# Patient Record
Sex: Female | Born: 1935 | Race: White | Hispanic: No | Smoking: Never smoker
Health system: Southern US, Community
[De-identification: ages and names within clinical notes are randomized; demographics above are authoritative.]

## PROBLEM LIST (undated history)

## (undated) DIAGNOSIS — E119 Type 2 diabetes mellitus without complications: Secondary | ICD-10-CM

## (undated) DIAGNOSIS — D649 Anemia, unspecified: Secondary | ICD-10-CM

## (undated) DIAGNOSIS — I2510 Atherosclerotic heart disease of native coronary artery without angina pectoris: Secondary | ICD-10-CM

## (undated) DIAGNOSIS — K759 Inflammatory liver disease, unspecified: Secondary | ICD-10-CM

## (undated) DIAGNOSIS — N898 Other specified noninflammatory disorders of vagina: Secondary | ICD-10-CM

## (undated) DIAGNOSIS — I10 Essential (primary) hypertension: Secondary | ICD-10-CM

## (undated) DIAGNOSIS — F329 Major depressive disorder, single episode, unspecified: Secondary | ICD-10-CM

## (undated) DIAGNOSIS — F32A Depression, unspecified: Secondary | ICD-10-CM

## (undated) DIAGNOSIS — K8020 Calculus of gallbladder without cholecystitis without obstruction: Secondary | ICD-10-CM

## (undated) HISTORY — PX: DIAGNOSTIC LAPAROSCOPY: SUR761

## (undated) HISTORY — PX: ABDOMINAL HYSTERECTOMY: SHX81

---

## 2004-02-10 ENCOUNTER — Ambulatory Visit: Payer: Self-pay | Admitting: Internal Medicine

## 2004-07-12 ENCOUNTER — Ambulatory Visit: Payer: Self-pay | Admitting: Gynecologic Oncology

## 2004-08-23 ENCOUNTER — Ambulatory Visit: Payer: Self-pay | Admitting: Internal Medicine

## 2004-11-29 ENCOUNTER — Ambulatory Visit: Payer: Self-pay | Admitting: Oncology

## 2005-04-18 ENCOUNTER — Ambulatory Visit: Payer: Self-pay | Admitting: Gynecologic Oncology

## 2005-05-01 ENCOUNTER — Ambulatory Visit: Payer: Self-pay | Admitting: Internal Medicine

## 2005-05-15 ENCOUNTER — Other Ambulatory Visit: Payer: Self-pay

## 2005-05-15 ENCOUNTER — Ambulatory Visit: Payer: Self-pay | Admitting: Gynecologic Oncology

## 2005-05-16 ENCOUNTER — Ambulatory Visit: Payer: Self-pay | Admitting: Gynecologic Oncology

## 2005-06-13 ENCOUNTER — Ambulatory Visit: Payer: Self-pay | Admitting: Gynecologic Oncology

## 2006-03-13 ENCOUNTER — Ambulatory Visit: Payer: Self-pay | Admitting: Unknown Physician Specialty

## 2006-06-06 ENCOUNTER — Ambulatory Visit: Payer: Self-pay | Admitting: Internal Medicine

## 2007-08-07 ENCOUNTER — Ambulatory Visit: Payer: Self-pay | Admitting: Internal Medicine

## 2007-08-28 ENCOUNTER — Ambulatory Visit: Payer: Self-pay | Admitting: Internal Medicine

## 2008-03-02 ENCOUNTER — Ambulatory Visit: Payer: Self-pay | Admitting: Gynecologic Oncology

## 2008-03-16 ENCOUNTER — Ambulatory Visit: Payer: Self-pay | Admitting: Gynecologic Oncology

## 2008-04-02 ENCOUNTER — Ambulatory Visit: Payer: Self-pay | Admitting: Gynecologic Oncology

## 2008-04-09 ENCOUNTER — Ambulatory Visit: Payer: Self-pay | Admitting: Gynecologic Oncology

## 2008-04-13 ENCOUNTER — Ambulatory Visit: Payer: Self-pay | Admitting: Gynecologic Oncology

## 2008-04-20 ENCOUNTER — Ambulatory Visit: Payer: Self-pay | Admitting: Gynecologic Oncology

## 2008-05-03 ENCOUNTER — Ambulatory Visit: Payer: Self-pay | Admitting: Gynecologic Oncology

## 2008-05-18 ENCOUNTER — Ambulatory Visit: Payer: Self-pay | Admitting: Gynecologic Oncology

## 2008-08-31 ENCOUNTER — Ambulatory Visit: Payer: Self-pay | Admitting: Gynecologic Oncology

## 2008-09-21 ENCOUNTER — Ambulatory Visit: Payer: Self-pay | Admitting: Gynecologic Oncology

## 2008-10-15 ENCOUNTER — Ambulatory Visit: Payer: Self-pay | Admitting: Internal Medicine

## 2008-12-31 ENCOUNTER — Ambulatory Visit: Payer: Self-pay | Admitting: Gynecologic Oncology

## 2009-01-18 ENCOUNTER — Ambulatory Visit: Payer: Self-pay | Admitting: Gynecologic Oncology

## 2009-01-31 ENCOUNTER — Ambulatory Visit: Payer: Self-pay | Admitting: Gynecologic Oncology

## 2009-05-03 ENCOUNTER — Ambulatory Visit: Payer: Self-pay | Admitting: Gynecologic Oncology

## 2009-05-17 ENCOUNTER — Ambulatory Visit: Payer: Self-pay | Admitting: Gynecologic Oncology

## 2009-05-31 ENCOUNTER — Ambulatory Visit: Payer: Self-pay | Admitting: Gynecologic Oncology

## 2009-07-31 ENCOUNTER — Ambulatory Visit: Payer: Self-pay | Admitting: Gynecologic Oncology

## 2009-08-16 ENCOUNTER — Ambulatory Visit: Payer: Self-pay | Admitting: Gynecologic Oncology

## 2009-08-31 ENCOUNTER — Ambulatory Visit: Payer: Self-pay | Admitting: Gynecologic Oncology

## 2009-09-06 ENCOUNTER — Ambulatory Visit: Payer: Self-pay | Admitting: Gynecologic Oncology

## 2009-09-30 ENCOUNTER — Ambulatory Visit: Payer: Self-pay | Admitting: Gynecologic Oncology

## 2009-10-18 ENCOUNTER — Ambulatory Visit: Payer: Self-pay | Admitting: Internal Medicine

## 2010-02-14 ENCOUNTER — Ambulatory Visit: Payer: Self-pay | Admitting: Gynecologic Oncology

## 2010-03-02 ENCOUNTER — Ambulatory Visit: Payer: Self-pay | Admitting: Gynecologic Oncology

## 2010-03-13 ENCOUNTER — Ambulatory Visit: Payer: Self-pay | Admitting: Unknown Physician Specialty

## 2010-06-20 ENCOUNTER — Ambulatory Visit: Payer: Self-pay | Admitting: Gynecologic Oncology

## 2010-08-01 ENCOUNTER — Ambulatory Visit: Payer: Self-pay | Admitting: Gynecologic Oncology

## 2010-08-07 LAB — PATHOLOGY REPORT

## 2010-08-25 ENCOUNTER — Ambulatory Visit: Payer: Self-pay | Admitting: Gynecologic Oncology

## 2010-09-01 ENCOUNTER — Ambulatory Visit: Payer: Self-pay | Admitting: Gynecologic Oncology

## 2010-09-05 ENCOUNTER — Ambulatory Visit: Payer: Self-pay | Admitting: Gynecologic Oncology

## 2010-10-01 ENCOUNTER — Ambulatory Visit: Payer: Self-pay | Admitting: Gynecologic Oncology

## 2010-11-01 ENCOUNTER — Ambulatory Visit: Payer: Self-pay | Admitting: Gynecologic Oncology

## 2010-11-08 ENCOUNTER — Ambulatory Visit: Payer: Self-pay | Admitting: Internal Medicine

## 2011-02-13 ENCOUNTER — Ambulatory Visit: Payer: Self-pay | Admitting: Gynecologic Oncology

## 2011-03-03 ENCOUNTER — Ambulatory Visit: Payer: Self-pay | Admitting: Gynecologic Oncology

## 2011-06-19 ENCOUNTER — Ambulatory Visit: Payer: Self-pay | Admitting: Gynecologic Oncology

## 2011-07-02 ENCOUNTER — Ambulatory Visit: Payer: Self-pay | Admitting: Gynecologic Oncology

## 2011-10-16 ENCOUNTER — Ambulatory Visit: Payer: Self-pay | Admitting: Gynecologic Oncology

## 2011-11-01 ENCOUNTER — Ambulatory Visit: Payer: Self-pay | Admitting: Gynecologic Oncology

## 2011-11-13 ENCOUNTER — Ambulatory Visit: Payer: Self-pay | Admitting: Internal Medicine

## 2012-02-19 ENCOUNTER — Ambulatory Visit: Payer: Self-pay | Admitting: Gynecologic Oncology

## 2012-03-02 ENCOUNTER — Ambulatory Visit: Payer: Self-pay | Admitting: Gynecologic Oncology

## 2012-08-19 ENCOUNTER — Ambulatory Visit: Payer: Self-pay | Admitting: Gynecologic Oncology

## 2012-08-31 ENCOUNTER — Ambulatory Visit: Payer: Self-pay | Admitting: Gynecologic Oncology

## 2012-11-13 ENCOUNTER — Ambulatory Visit: Payer: Self-pay | Admitting: Internal Medicine

## 2013-03-03 ENCOUNTER — Ambulatory Visit: Payer: Self-pay | Admitting: Gynecologic Oncology

## 2013-04-02 ENCOUNTER — Ambulatory Visit: Payer: Self-pay | Admitting: Gynecologic Oncology

## 2013-08-31 ENCOUNTER — Ambulatory Visit: Payer: Self-pay | Admitting: Gynecologic Oncology

## 2013-09-30 ENCOUNTER — Ambulatory Visit: Payer: Self-pay | Admitting: Gynecologic Oncology

## 2013-12-22 ENCOUNTER — Ambulatory Visit: Payer: Self-pay | Admitting: Internal Medicine

## 2014-12-03 ENCOUNTER — Other Ambulatory Visit: Payer: Self-pay | Admitting: Internal Medicine

## 2014-12-03 DIAGNOSIS — Z1231 Encounter for screening mammogram for malignant neoplasm of breast: Secondary | ICD-10-CM

## 2014-12-24 ENCOUNTER — Other Ambulatory Visit: Payer: Self-pay | Admitting: Internal Medicine

## 2014-12-24 ENCOUNTER — Ambulatory Visit
Admission: RE | Admit: 2014-12-24 | Discharge: 2014-12-24 | Disposition: A | Discharge status: Home / Self Care | Payer: Medicare Other | Source: Ambulatory Visit | Attending: Internal Medicine | Admitting: Internal Medicine

## 2014-12-24 DIAGNOSIS — Z1231 Encounter for screening mammogram for malignant neoplasm of breast: Secondary | ICD-10-CM | POA: Insufficient documentation

## 2015-04-08 ENCOUNTER — Other Ambulatory Visit: Payer: Self-pay | Admitting: Internal Medicine

## 2015-04-08 DIAGNOSIS — K8689 Other specified diseases of pancreas: Secondary | ICD-10-CM

## 2015-04-20 ENCOUNTER — Ambulatory Visit
Admission: RE | Admit: 2015-04-20 | Discharge: 2015-04-20 | Disposition: A | Discharge status: Home / Self Care | Payer: Medicare Other | Source: Ambulatory Visit | Attending: Internal Medicine | Admitting: Internal Medicine

## 2015-04-20 DIAGNOSIS — N281 Cyst of kidney, acquired: Secondary | ICD-10-CM | POA: Diagnosis not present

## 2015-04-20 DIAGNOSIS — K8689 Other specified diseases of pancreas: Secondary | ICD-10-CM

## 2015-04-20 DIAGNOSIS — K869 Disease of pancreas, unspecified: Secondary | ICD-10-CM | POA: Diagnosis not present

## 2015-04-20 DIAGNOSIS — K7689 Other specified diseases of liver: Secondary | ICD-10-CM | POA: Insufficient documentation

## 2015-04-20 MED ORDER — GADOBENATE DIMEGLUMINE 529 MG/ML IV SOLN
20.0000 mL | Freq: Once | INTRAVENOUS | Status: AC | PRN
  Administered 2015-04-20: 19 mL via INTRAVENOUS

## 2015-06-03 ENCOUNTER — Encounter: Payer: Self-pay | Admitting: *Deleted

## 2015-06-06 ENCOUNTER — Ambulatory Visit
Admission: RE | Admit: 2015-06-06 | Discharge: 2015-06-06 | Disposition: A | Discharge status: Home / Self Care | Payer: Medicare Other | Source: Ambulatory Visit | Attending: Unknown Physician Specialty | Admitting: Unknown Physician Specialty

## 2015-06-06 ENCOUNTER — Ambulatory Visit: Payer: Medicare Other | Admitting: Anesthesiology

## 2015-06-06 ENCOUNTER — Encounter: Payer: Self-pay | Admitting: *Deleted

## 2015-06-06 ENCOUNTER — Encounter
Admission: RE | Disposition: A | Discharge status: Home / Self Care | Payer: Self-pay | Source: Ambulatory Visit | Attending: Unknown Physician Specialty

## 2015-06-06 DIAGNOSIS — Z6831 Body mass index (BMI) 31.0-31.9, adult: Secondary | ICD-10-CM | POA: Diagnosis not present

## 2015-06-06 DIAGNOSIS — Z7982 Long term (current) use of aspirin: Secondary | ICD-10-CM | POA: Diagnosis not present

## 2015-06-06 DIAGNOSIS — K640 First degree hemorrhoids: Secondary | ICD-10-CM | POA: Diagnosis not present

## 2015-06-06 DIAGNOSIS — D123 Benign neoplasm of transverse colon: Secondary | ICD-10-CM | POA: Insufficient documentation

## 2015-06-06 DIAGNOSIS — E119 Type 2 diabetes mellitus without complications: Secondary | ICD-10-CM | POA: Diagnosis not present

## 2015-06-06 DIAGNOSIS — F329 Major depressive disorder, single episode, unspecified: Secondary | ICD-10-CM | POA: Insufficient documentation

## 2015-06-06 DIAGNOSIS — Z1211 Encounter for screening for malignant neoplasm of colon: Secondary | ICD-10-CM | POA: Insufficient documentation

## 2015-06-06 DIAGNOSIS — I2510 Atherosclerotic heart disease of native coronary artery without angina pectoris: Secondary | ICD-10-CM | POA: Diagnosis not present

## 2015-06-06 DIAGNOSIS — Z79899 Other long term (current) drug therapy: Secondary | ICD-10-CM | POA: Insufficient documentation

## 2015-06-06 DIAGNOSIS — Z90710 Acquired absence of both cervix and uterus: Secondary | ICD-10-CM | POA: Insufficient documentation

## 2015-06-06 DIAGNOSIS — I10 Essential (primary) hypertension: Secondary | ICD-10-CM | POA: Diagnosis not present

## 2015-06-06 HISTORY — PX: COLONOSCOPY WITH PROPOFOL: SHX5780

## 2015-06-06 HISTORY — DX: Calculus of gallbladder without cholecystitis without obstruction: K80.20

## 2015-06-06 HISTORY — DX: Depression, unspecified: F32.A

## 2015-06-06 HISTORY — DX: Morbid (severe) obesity due to excess calories: E66.01

## 2015-06-06 HISTORY — DX: Atherosclerotic heart disease of native coronary artery without angina pectoris: I25.10

## 2015-06-06 HISTORY — DX: Other specified noninflammatory disorders of vagina: N89.8

## 2015-06-06 HISTORY — DX: Major depressive disorder, single episode, unspecified: F32.9

## 2015-06-06 HISTORY — DX: Type 2 diabetes mellitus without complications: E11.9

## 2015-06-06 HISTORY — DX: Anemia, unspecified: D64.9

## 2015-06-06 HISTORY — DX: Essential (primary) hypertension: I10

## 2015-06-06 HISTORY — DX: Inflammatory liver disease, unspecified: K75.9

## 2015-06-06 LAB — GLUCOSE, CAPILLARY: Glucose-Capillary: 186 mg/dL — ABNORMAL HIGH (ref 65–99)

## 2015-06-06 SURGERY — COLONOSCOPY WITH PROPOFOL
Anesthesia: General

## 2015-06-06 MED ORDER — MIDAZOLAM HCL 5 MG/5ML IJ SOLN
INTRAMUSCULAR | Status: DC | PRN
  Administered 2015-06-06: 1 mg via INTRAVENOUS

## 2015-06-06 MED ORDER — SODIUM CHLORIDE 0.9 % IV SOLN
INTRAVENOUS | Status: DC
  Administered 2015-06-06: 07:00:00 via INTRAVENOUS

## 2015-06-06 MED ORDER — PROPOFOL 500 MG/50ML IV EMUL
INTRAVENOUS | Status: DC | PRN
  Administered 2015-06-06: 100 ug/kg/min via INTRAVENOUS

## 2015-06-06 MED ORDER — GLYCOPYRROLATE 0.2 MG/ML IJ SOLN
INTRAMUSCULAR | Status: DC | PRN
  Administered 2015-06-06: 0.2 mg via INTRAVENOUS

## 2015-06-06 MED ORDER — PROPOFOL 10 MG/ML IV BOLUS
INTRAVENOUS | Status: DC | PRN
  Administered 2015-06-06: 50 mg via INTRAVENOUS

## 2015-06-06 MED ORDER — SODIUM CHLORIDE 0.9 % IV SOLN: INTRAVENOUS | Status: DC

## 2015-06-06 MED ORDER — LIDOCAINE HCL (CARDIAC) 20 MG/ML IV SOLN
INTRAVENOUS | Status: DC | PRN
  Administered 2015-06-06: 100 mg via INTRAVENOUS

## 2015-06-06 MED ORDER — FENTANYL CITRATE (PF) 100 MCG/2ML IJ SOLN
INTRAMUSCULAR | Status: DC | PRN
  Administered 2015-06-06: 50 ug via INTRAVENOUS

## 2015-06-06 NOTE — Transfer of Care (Signed)
Immediate Anesthesia Transfer of Care Note  Patient: Tamara Whitney  Procedure(s) Performed: Procedure(s): COLONOSCOPY WITH PROPOFOL (N/A)  Patient Location: PACU  Anesthesia Type:General  Level of Consciousness: sedated  Airway & Oxygen Therapy: Patient Spontanous Breathing and Patient connected to nasal cannula oxygen  Post-op Assessment: Report given to RN and Post -op Vital signs reviewed and stable  Post vital signs: Reviewed and stable  Last Vitals:  Filed Vitals:   06/06/15 0802 06/06/15 0804  BP: 127/52 127/52  Pulse: 89 90  Temp: 36.1 C 36.1 C  Resp: 16 22    Complications: No apparent anesthesia complications

## 2015-06-06 NOTE — Anesthesia Preprocedure Evaluation (Signed)
Anesthesia Evaluation  Patient identified by MRN, date of birth, ID band Patient awake    Reviewed: Allergy & Precautions, NPO status , Patient's Chart, lab work & pertinent test results, reviewed documented beta blocker date and time   Airway Mallampati: II  TM Distance: >3 FB     Dental  (+) Chipped   Pulmonary           Cardiovascular hypertension, Pt. on medications + CAD       Neuro/Psych PSYCHIATRIC DISORDERS Depression    GI/Hepatic (+) Hepatitis -  Endo/Other  diabetes  Renal/GU      Musculoskeletal   Abdominal   Peds  Hematology  (+) anemia ,   Anesthesia Other Findings Obese.  Reproductive/Obstetrics                             Anesthesia Physical Anesthesia Plan  ASA: III  Anesthesia Plan: General   Post-op Pain Management:    Induction: Intravenous  Airway Management Planned: Nasal Cannula  Additional Equipment:   Intra-op Plan:   Post-operative Plan:   Informed Consent: I have reviewed the patients History and Physical, chart, labs and discussed the procedure including the risks, benefits and alternatives for the proposed anesthesia with the patient or authorized representative who has indicated his/her understanding and acceptance.     Plan Discussed with: CRNA  Anesthesia Plan Comments:         Anesthesia Quick Evaluation

## 2015-06-06 NOTE — Anesthesia Postprocedure Evaluation (Signed)
Anesthesia Post Note  Patient: Tamara Whitney  Procedure(s) Performed: Procedure(s) (LRB): COLONOSCOPY WITH PROPOFOL (N/A)  Patient location during evaluation: Endoscopy Anesthesia Type: General Level of consciousness: awake and alert Pain management: pain level controlled Vital Signs Assessment: post-procedure vital signs reviewed and stable Respiratory status: spontaneous breathing, nonlabored ventilation, respiratory function stable and patient connected to nasal cannula oxygen Cardiovascular status: blood pressure returned to baseline and stable Postop Assessment: no signs of nausea or vomiting Anesthetic complications: no    Last Vitals:  Filed Vitals:   06/06/15 0832 06/06/15 0842  BP: 166/68 174/66  Pulse: 78 72  Temp:    Resp: 18 19    Last Pain: There were no vitals filed for this visit.               Jonelle Bann S

## 2015-06-06 NOTE — H&P (Signed)
   Primary Care Physician:  Kirk Ruths., MD Primary Gastroenterologist:  Dr. Vira Agar  Pre-Procedure History & Physical: HPI:  Tamara Whitney is a 81 y.o. female is here for an colonoscopy.   Past Medical History  Diagnosis Date  . Coronary artery disease   . Depression   . Diabetes mellitus without complication ([place])   . Hepatitis   . Hypertension   . Anemia   . Vaginal lesion   . Morbid obesity ([place])   . Gall stones     Past Surgical History  Procedure Laterality Date  . Diagnostic laparoscopy    . Abdominal hysterectomy      Prior to Admission medications   Medication Sig Start Date End Date Taking? Authorizing Provider  amLODipine-benazepril (LOTREL) 5-20 MG capsule Take 1 capsule by mouth daily.   Yes Historical Provider, MD  aspirin (ASPIRIN EC) 81 MG EC tablet Take 81 mg by mouth daily. Swallow whole.   Yes Historical Provider, MD  Cholecalciferol 2000 units CAPS Take 2,000 Units by mouth.   Yes Historical Provider, MD  glimepiride (AMARYL) 2 MG tablet Take 2 mg by mouth daily with breakfast.   Yes Historical Provider, MD  losartan-hydrochlorothiazide (HYZAAR) 100-12.5 MG tablet Take 1 tablet by mouth daily.   Yes Historical Provider, MD  metFORMIN (GLUCOPHAGE) 500 MG tablet Take 500 mg by mouth 2 (two) times daily with a meal.   Yes Historical Provider, MD    Allergies as of 05/26/2015  . (Not on File)    History reviewed. No pertinent family history.  Social History   Social History  . Marital Status: Divorced    Spouse Name: N/A  . Number of Children: N/A  . Years of Education: N/A   Occupational History  . Not on file.   Social History Main Topics  . Smoking status: Never Smoker   . Smokeless tobacco: Never Used  . Alcohol Use: No  . Drug Use: No  . Sexual Activity: Not on file   Other Topics Concern  . Not on file   Social History Narrative    Review of Systems: See HPI, otherwise negative ROS  Physical Exam: BP 193/77 mmHg   Pulse 65  Temp(Src) 97.7 F (36.5 C) (Tympanic)  Resp 16  Ht 5\' 8"  (1.727 m)  Wt 95.255 kg (210 lb)  BMI 31.94 kg/m2  SpO2 100% General:   Alert,  pleasant and cooperative in NAD Head:  Normocephalic and atraumatic. Neck:  Supple; no masses or thyromegaly. Lungs:  Clear throughout to auscultation.    Heart:  Regular rate and rhythm. Abdomen:  Soft, nontender and nondistended. Normal bowel sounds, without guarding, and without rebound.   Neurologic:  Alert and  oriented x4;  grossly normal neurologically.  Impression/Plan: Tamara Whitney is here for an colonoscopy to be performed for [facility] colon polyps  Risks, benefits, limitations, and alternatives regarding  colonoscopy have been reviewed with the patient.  Questions have been answered.  All parties agreeable.   Gaylyn Cheers, MD  06/06/2015, 7:32 AM

## 2015-06-06 NOTE — Op Note (Signed)
[facility] Gastroenterology Patient Name: Tamara Whitney Procedure Date: 06/06/2015 7:27 AM MRN: [id number] Account #: [id number] Date of Birth: [birth year] Admit Type: Outpatient Age: 81 Room: [facility] ENDO ROOM 1 Gender: Female Note Status: Finalized Procedure:            Colonoscopy Indications:          High risk colon cancer surveillance: Personal history                        of colonic polyps Providers:            Manya Silvas, MD Referring MD:         Ocie Cornfield. Ouida Sills, MD (Referring MD) Medicines:            Propofol per Anesthesia Complications:        No immediate complications. Procedure:            Pre-Anesthesia Assessment:                       - After reviewing the risks and benefits, the patient                        was deemed in satisfactory condition to undergo the                        procedure.                       After obtaining informed consent, the colonoscope was                        passed under direct vision. Throughout the procedure,                        the patient's blood pressure, pulse, and oxygen                        saturations were monitored continuously. The                        Colonoscope was introduced through the anus and                        advanced to the the cecum, identified by appendiceal                        orifice and ileocecal valve. The colonoscopy was                        performed without difficulty. The patient tolerated the                        procedure well. The quality of the bowel preparation                        was excellent. Findings:      A diminutive polyp was found in the hepatic flexure. The polyp was       sessile. The polyp was removed with a jumbo cold forceps. Resection and       retrieval were complete.      Internal hemorrhoids were found  during endoscopy. The hemorrhoids were       small and Grade I (internal hemorrhoids that do not prolapse).      The exam was  otherwise without abnormality. Impression:           - One diminutive polyp at the hepatic flexure, removed                        with a jumbo cold forceps. Resected and retrieved.                       - Internal hemorrhoids.                       - The examination was otherwise normal. Recommendation:       - Await pathology results. Manya Silvas, MD 06/06/2015 7:58:43 AM This report has been signed electronically. Number of Addenda: 0 Note Initiated On: 06/06/2015 7:27 AM Scope Withdrawal Time: 0 hours 8 minutes 38 seconds  Total Procedure Duration: 0 hours 16 minutes 39 seconds       [facility]

## 2015-06-07 LAB — SURGICAL PATHOLOGY

## 2015-11-14 ENCOUNTER — Other Ambulatory Visit: Payer: Self-pay | Admitting: Internal Medicine

## 2015-11-14 DIAGNOSIS — Z1231 Encounter for screening mammogram for malignant neoplasm of breast: Secondary | ICD-10-CM

## 2015-12-26 ENCOUNTER — Other Ambulatory Visit: Payer: Self-pay | Admitting: Internal Medicine

## 2015-12-26 ENCOUNTER — Ambulatory Visit
Admission: RE | Admit: 2015-12-26 | Discharge: 2015-12-26 | Disposition: A | Discharge status: Home / Self Care | Payer: Medicare Other | Source: Ambulatory Visit | Attending: Internal Medicine | Admitting: Internal Medicine

## 2015-12-26 DIAGNOSIS — Z1231 Encounter for screening mammogram for malignant neoplasm of breast: Secondary | ICD-10-CM

## 2016-11-14 ENCOUNTER — Other Ambulatory Visit: Payer: Self-pay | Admitting: Internal Medicine

## 2016-11-14 DIAGNOSIS — Z1231 Encounter for screening mammogram for malignant neoplasm of breast: Secondary | ICD-10-CM

## 2016-12-13 ENCOUNTER — Ambulatory Visit
Admission: RE | Admit: 2016-12-13 | Discharge: 2016-12-13 | Disposition: A | Discharge status: Home / Self Care | Payer: Medicare Other | Source: Ambulatory Visit | Attending: Internal Medicine | Admitting: Internal Medicine

## 2016-12-13 DIAGNOSIS — Z1231 Encounter for screening mammogram for malignant neoplasm of breast: Secondary | ICD-10-CM | POA: Insufficient documentation

## 2016-12-21 ENCOUNTER — Other Ambulatory Visit: Payer: Self-pay | Admitting: Internal Medicine

## 2016-12-21 DIAGNOSIS — R928 Other abnormal and inconclusive findings on diagnostic imaging of breast: Secondary | ICD-10-CM

## 2016-12-21 DIAGNOSIS — N6489 Other specified disorders of breast: Secondary | ICD-10-CM

## 2016-12-27 ENCOUNTER — Ambulatory Visit
Admission: RE | Admit: 2016-12-27 | Discharge: 2016-12-27 | Disposition: A | Discharge status: Home / Self Care | Payer: Medicare Other | Source: Ambulatory Visit | Attending: Internal Medicine | Admitting: Internal Medicine

## 2016-12-27 DIAGNOSIS — N6489 Other specified disorders of breast: Secondary | ICD-10-CM | POA: Diagnosis present

## 2016-12-27 DIAGNOSIS — R928 Other abnormal and inconclusive findings on diagnostic imaging of breast: Secondary | ICD-10-CM

## 2017-11-19 ENCOUNTER — Other Ambulatory Visit: Payer: Self-pay | Admitting: Internal Medicine

## 2017-11-19 DIAGNOSIS — Z1231 Encounter for screening mammogram for malignant neoplasm of breast: Secondary | ICD-10-CM

## 2017-12-16 ENCOUNTER — Ambulatory Visit
Admission: RE | Admit: 2017-12-16 | Discharge: 2017-12-16 | Disposition: A | Discharge status: Home / Self Care | Payer: Medicare Other | Source: Ambulatory Visit | Attending: Internal Medicine | Admitting: Internal Medicine

## 2017-12-16 DIAGNOSIS — Z1231 Encounter for screening mammogram for malignant neoplasm of breast: Secondary | ICD-10-CM | POA: Diagnosis not present

## 2018-11-10 IMAGING — MG MM DIGITAL SCREENING BILAT W/ TOMO W/ CAD
8 series · 8 of 24 positions shown · non-contrast
Comparison: Previous exam(s).

CLINICAL DATA: Screening.

EXAM:
DIGITAL SCREENING BILATERAL MAMMOGRAM WITH TOMO AND CAD

[R CC synth-2D]
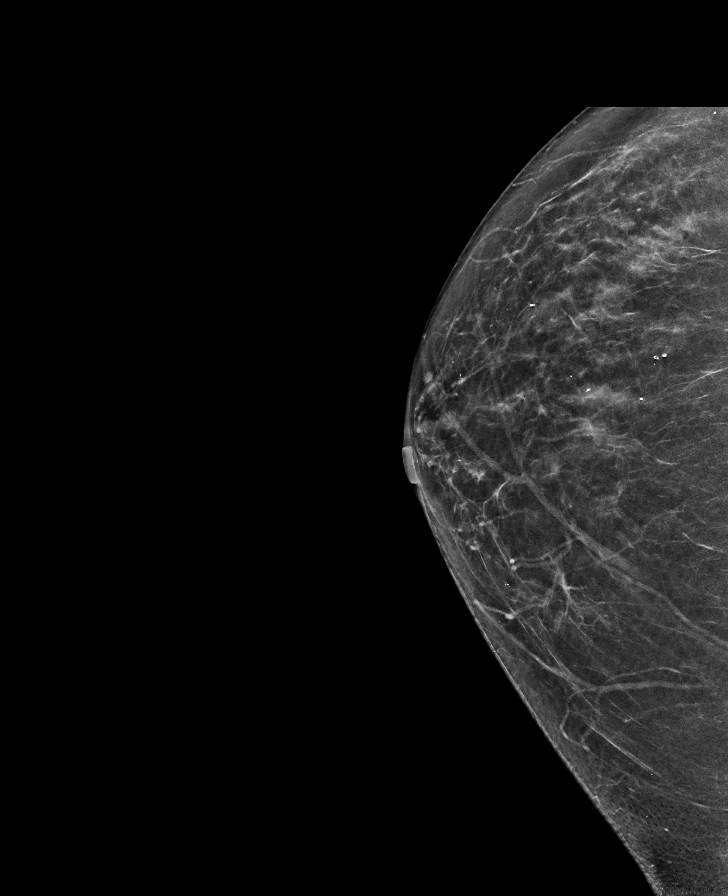

[R MLO synth-2D]
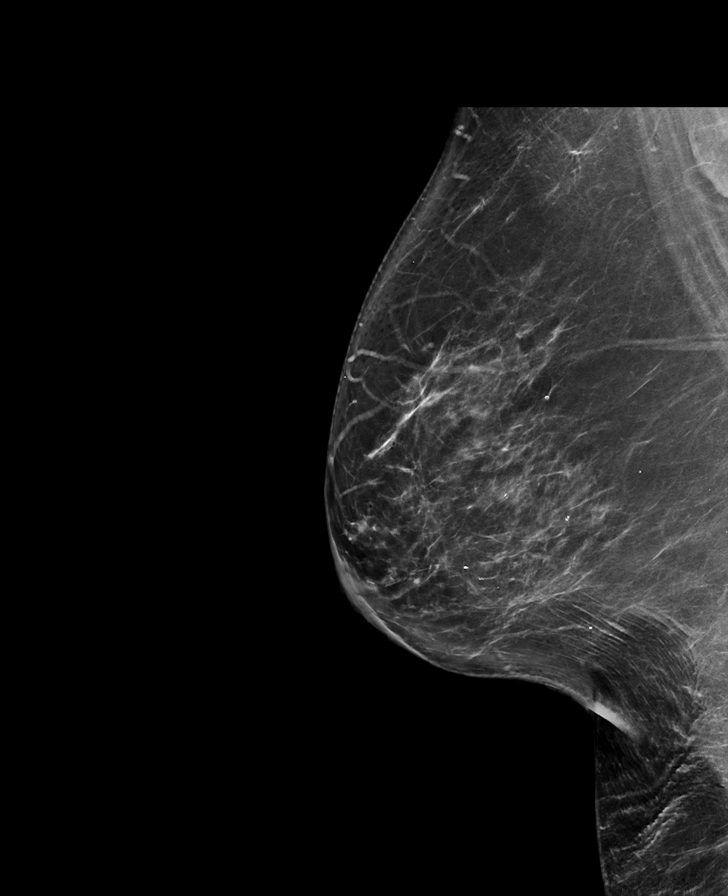

[L CC synth-2D]
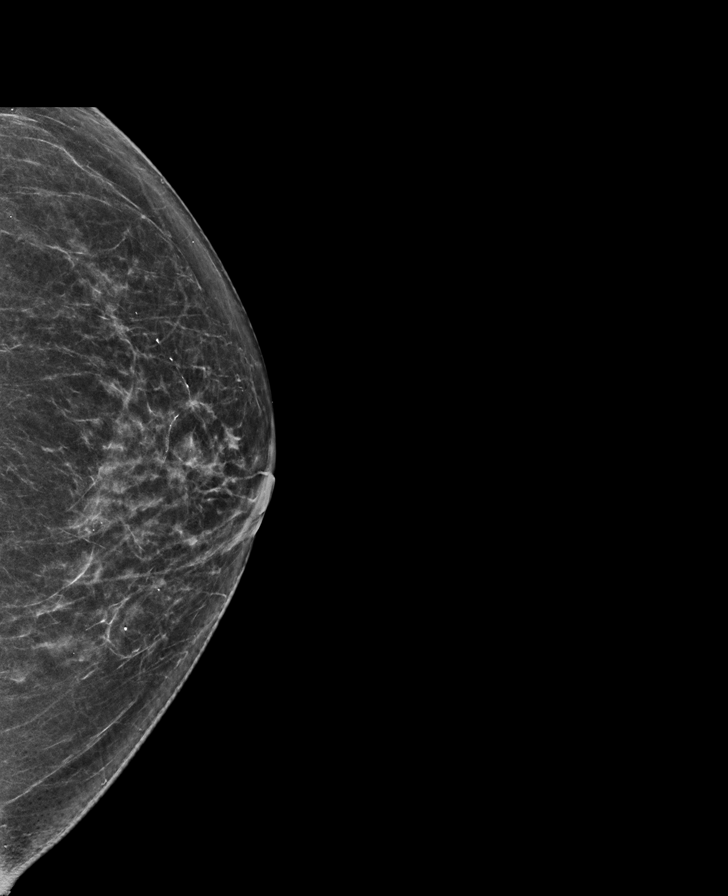

[L MLO synth-2D]
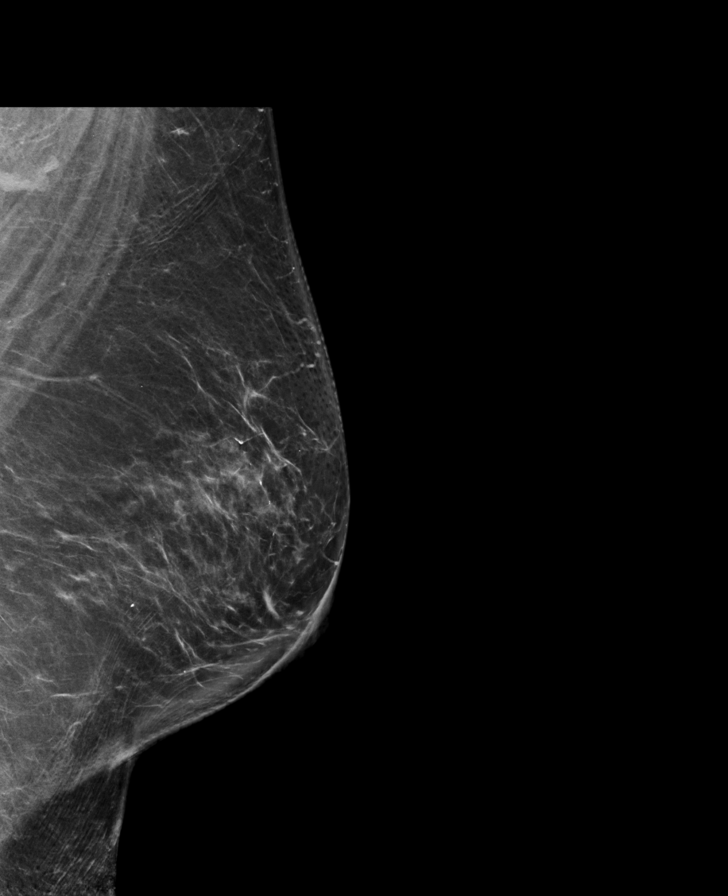

[R MLO tomo · tomo slice 47/92.0]
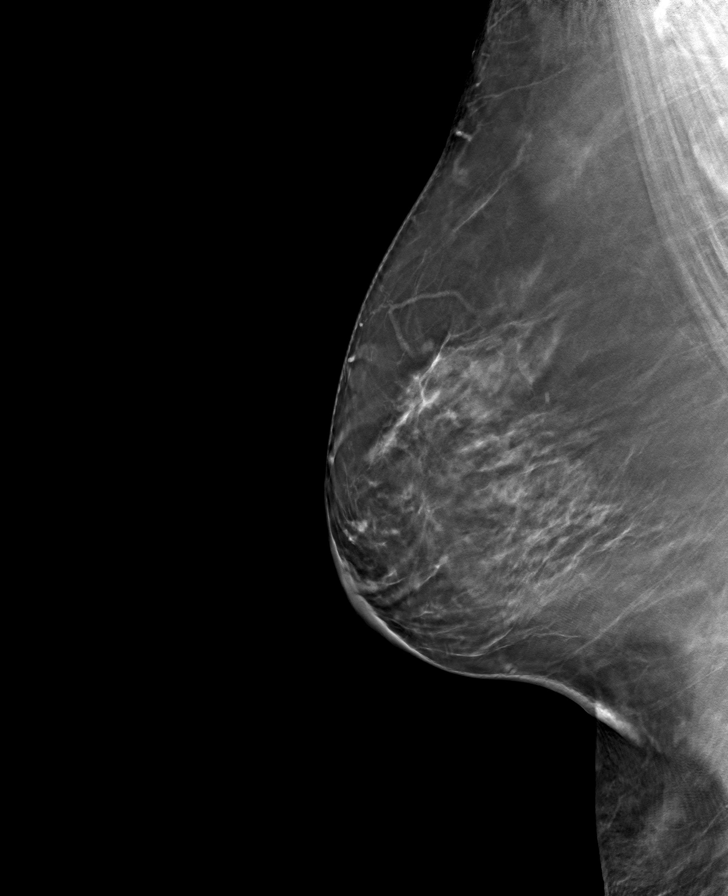

[L CC tomo · tomo slice 35/70.0]
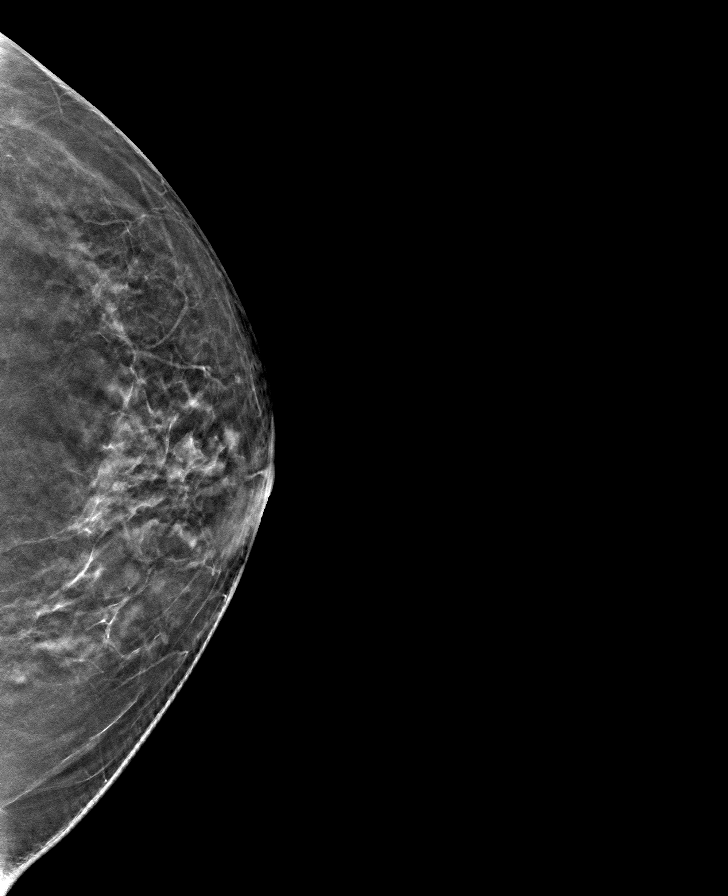

[L MLO tomo · tomo slice 47/93.0]
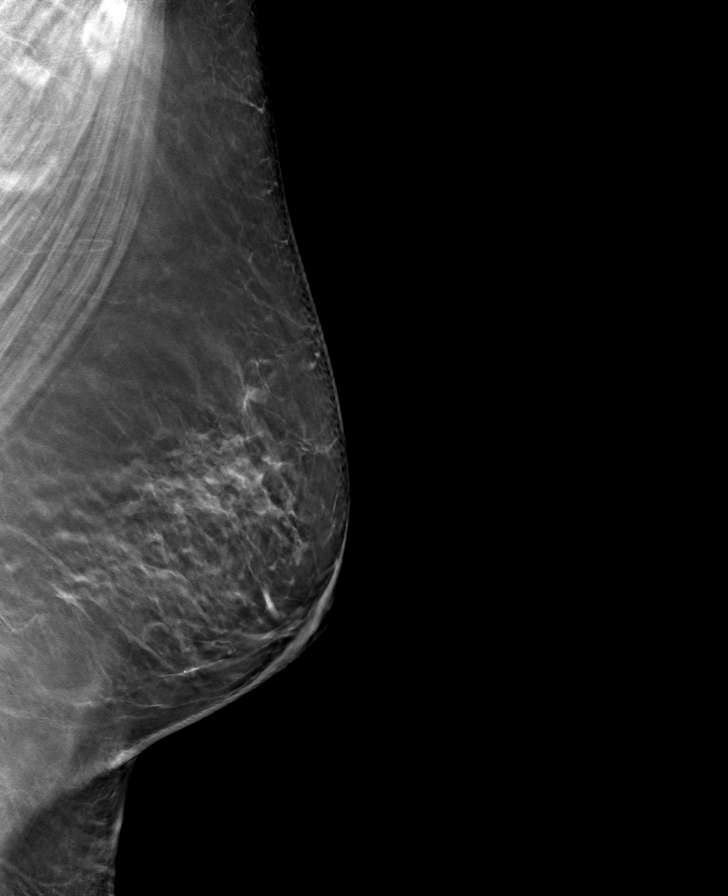

[R CC tomo · tomo slice 37/72.0]
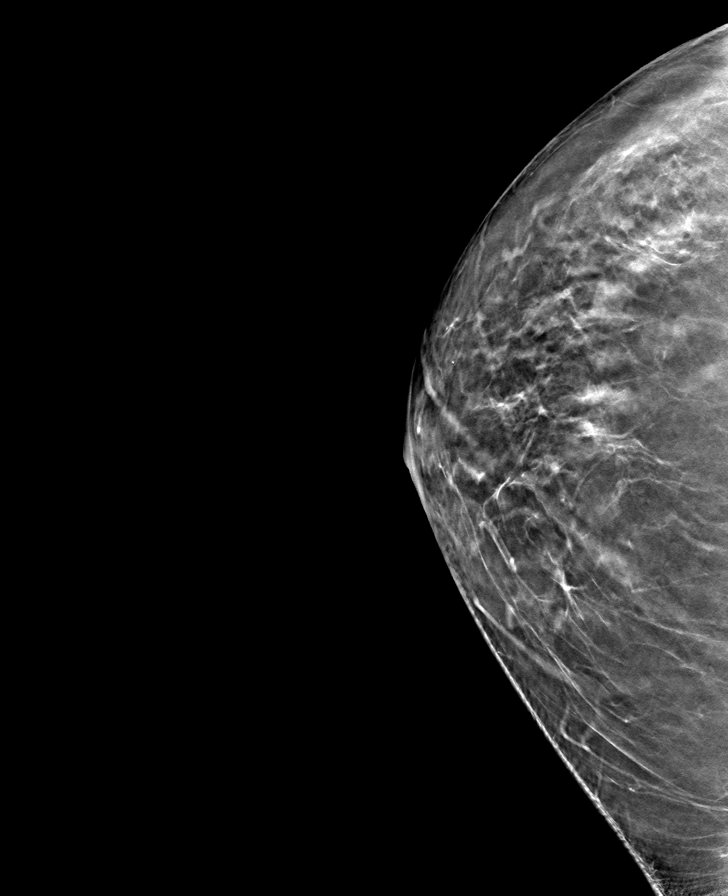

[8 of 24 positions shown; findings below may reference images not displayed]

ACR Breast Density Category b: There are scattered areas of
fibroglandular density.
FINDINGS: There are no findings suspicious for malignancy. Images were
processed with CAD.
IMPRESSION: No mammographic evidence of malignancy. A result letter of this
screening mammogram will be mailed directly to the patient.

RECOMMENDATION:
Screening mammogram in one year. (Code:[id number])

BI-RADS CATEGORY  1: Negative.

## 2018-12-02 ENCOUNTER — Other Ambulatory Visit: Payer: Self-pay | Admitting: Internal Medicine

## 2021-03-02 DEATH — deceased
